# Patient Record
Sex: Male | Born: 1985 | Race: White | Hispanic: No | Marital: Married | Smoking: Never smoker
Health system: Southern US, Community
[De-identification: ages and names within clinical notes are randomized; demographics above are authoritative.]

## PROBLEM LIST (undated history)

## (undated) ENCOUNTER — Emergency Department (HOSPITAL_COMMUNITY): Discharge status: Absent without leave | Payer: Self-pay

## (undated) HISTORY — PX: KNEE REPAIR EXTENSOR MECHANISM: SHX6613

---

## 2008-01-31 ENCOUNTER — Emergency Department (HOSPITAL_COMMUNITY): Admission: EM | Admit: 2008-01-31 | Discharge: 2008-02-01 | Payer: Self-pay | Admitting: Emergency Medicine

## 2010-07-11 ENCOUNTER — Emergency Department (HOSPITAL_COMMUNITY): Admission: EM | Admit: 2010-07-11 | Discharge: 2010-07-11 | Payer: Self-pay | Admitting: Family Medicine

## 2011-09-07 LAB — URINALYSIS, ROUTINE W REFLEX MICROSCOPIC
Glucose, UA: NEGATIVE
Ketones, ur: >80 — AB
Leukocytes, UA: NEGATIVE
Nitrite: NEGATIVE
Protein, ur: NEGATIVE
Specific Gravity, Urine: 1.035 — ABNORMAL HIGH
Urobilinogen, UA: 1
pH: 6

## 2011-09-07 LAB — CBC
HCT: 42.5
Hemoglobin: 14.7
MCHC: 34.6
MCV: 84.1
Platelets: 261
RBC: 5.05
RDW: 13.8
WBC: 8.1

## 2011-09-07 LAB — COMPREHENSIVE METABOLIC PANEL WITH GFR
AST: 22
BUN: 20
Calcium: 9.2
GFR calc Af Amer: >60
GFR calc non Af Amer: >60
Glucose, Bld: 101 — ABNORMAL HIGH
Potassium: 3.4 — ABNORMAL LOW
Sodium: 139

## 2011-09-07 LAB — DIFFERENTIAL
Basophils Absolute: 0
Basophils Relative: 1
Eosinophils Absolute: 0.1
Eosinophils Relative: 2
Lymphocytes Relative: 38
Lymphs Abs: 3.1
Monocytes Absolute: 0.9
Monocytes Relative: 11
Neutro Abs: 3.9
Neutrophils Relative %: 49

## 2011-09-07 LAB — COMPREHENSIVE METABOLIC PANEL
ALT: 15
Albumin: 4.1
Alkaline Phosphatase: 58
CO2: 25
Chloride: 104
Creatinine, Ser: 1.11
Total Bilirubin: 0.9
Total Protein: 7.1

## 2011-09-07 LAB — URINE MICROSCOPIC-ADD ON

## 2011-09-07 IMAGING — CR DG ANKLE COMPLETE 3+V*L*
3 series · 3 of 3 positions shown · non-contrast
Comparison: None.

CLINICAL DATA: Injury and pain laterally located.

LEFT ANKLE COMPLETE - 3+ VIEW

[view not recorded (1 of 3)]
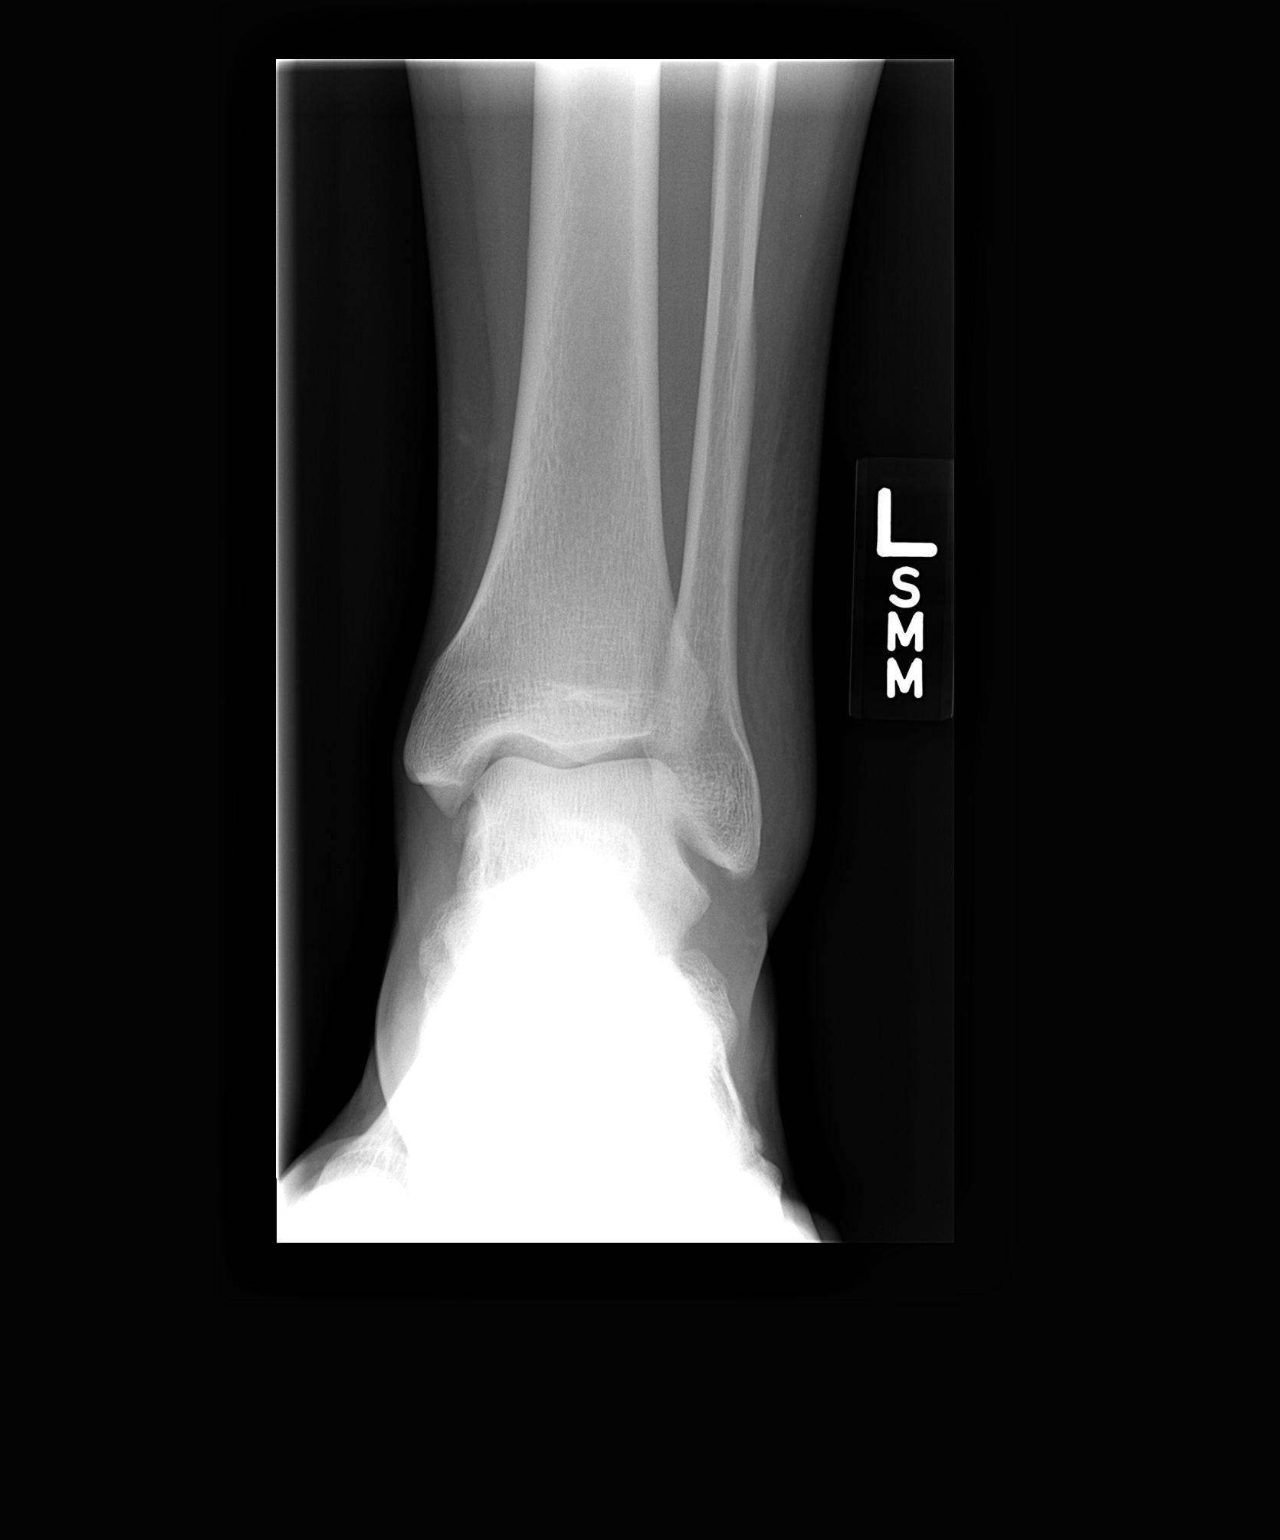

[view not recorded (2 of 3)]
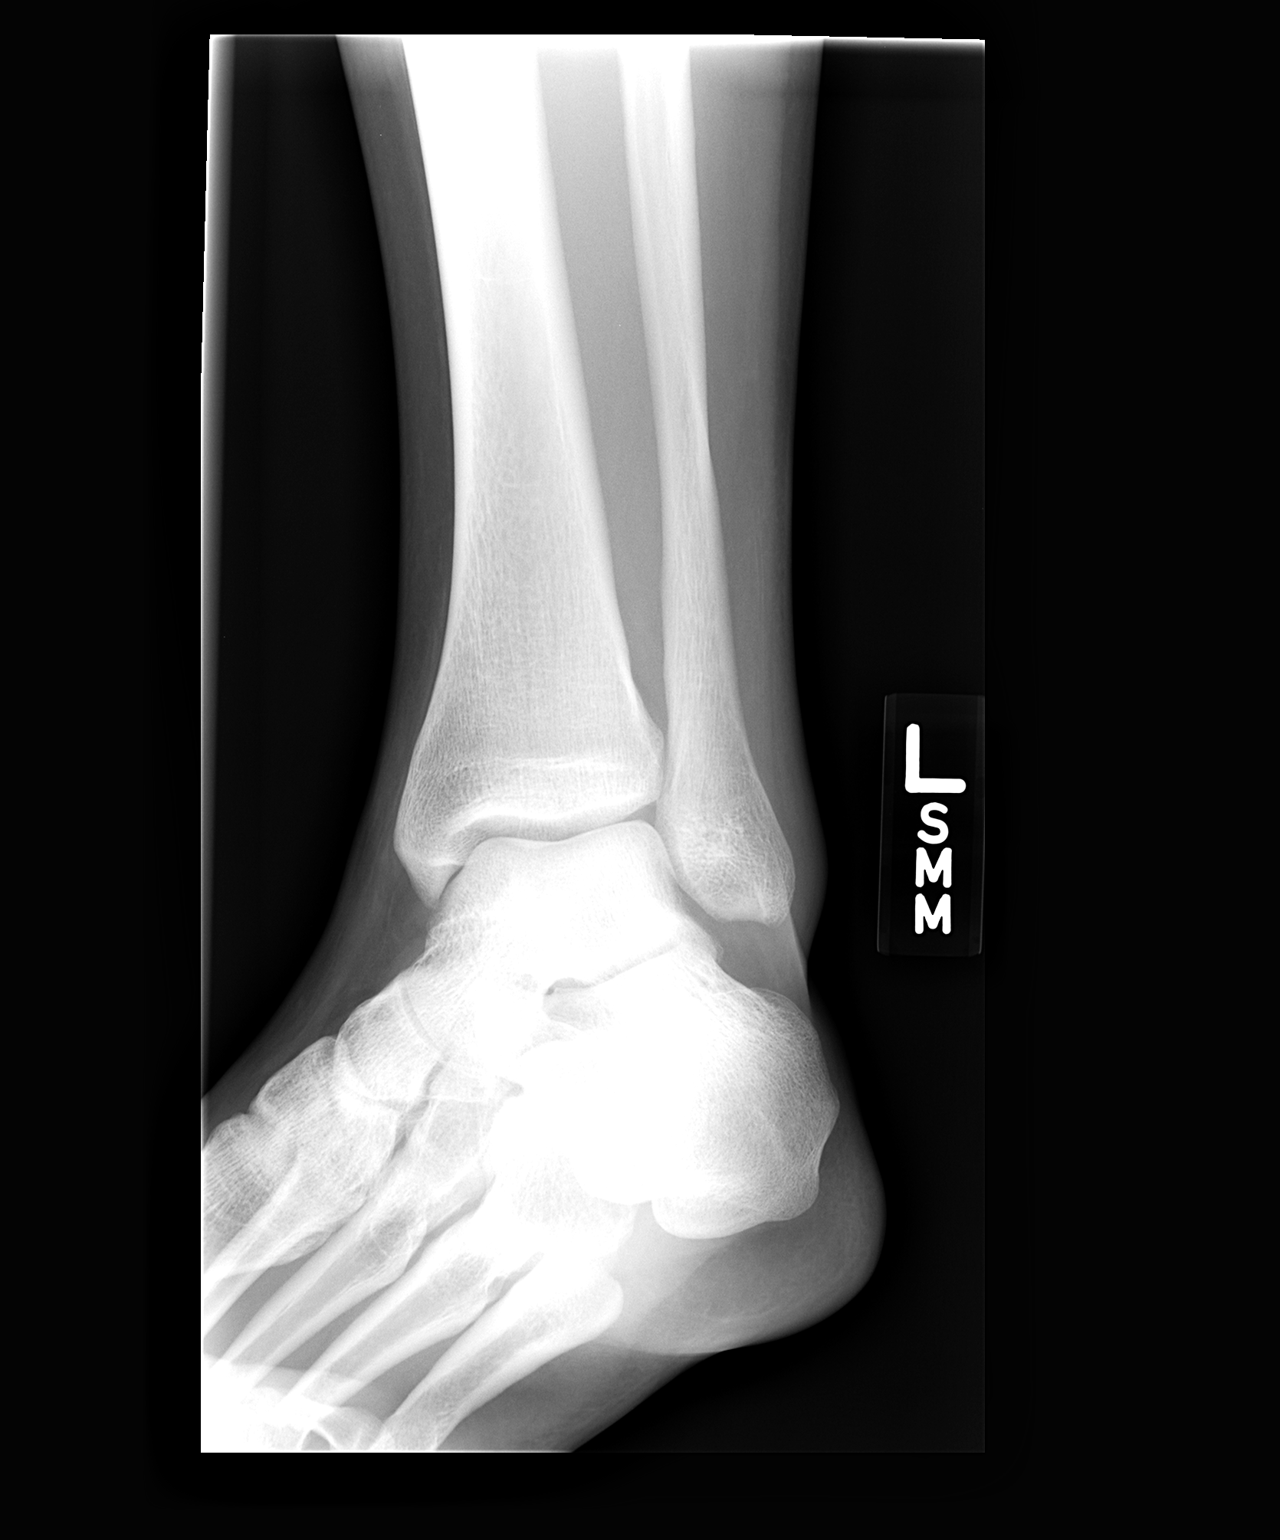

[view not recorded (3 of 3)]
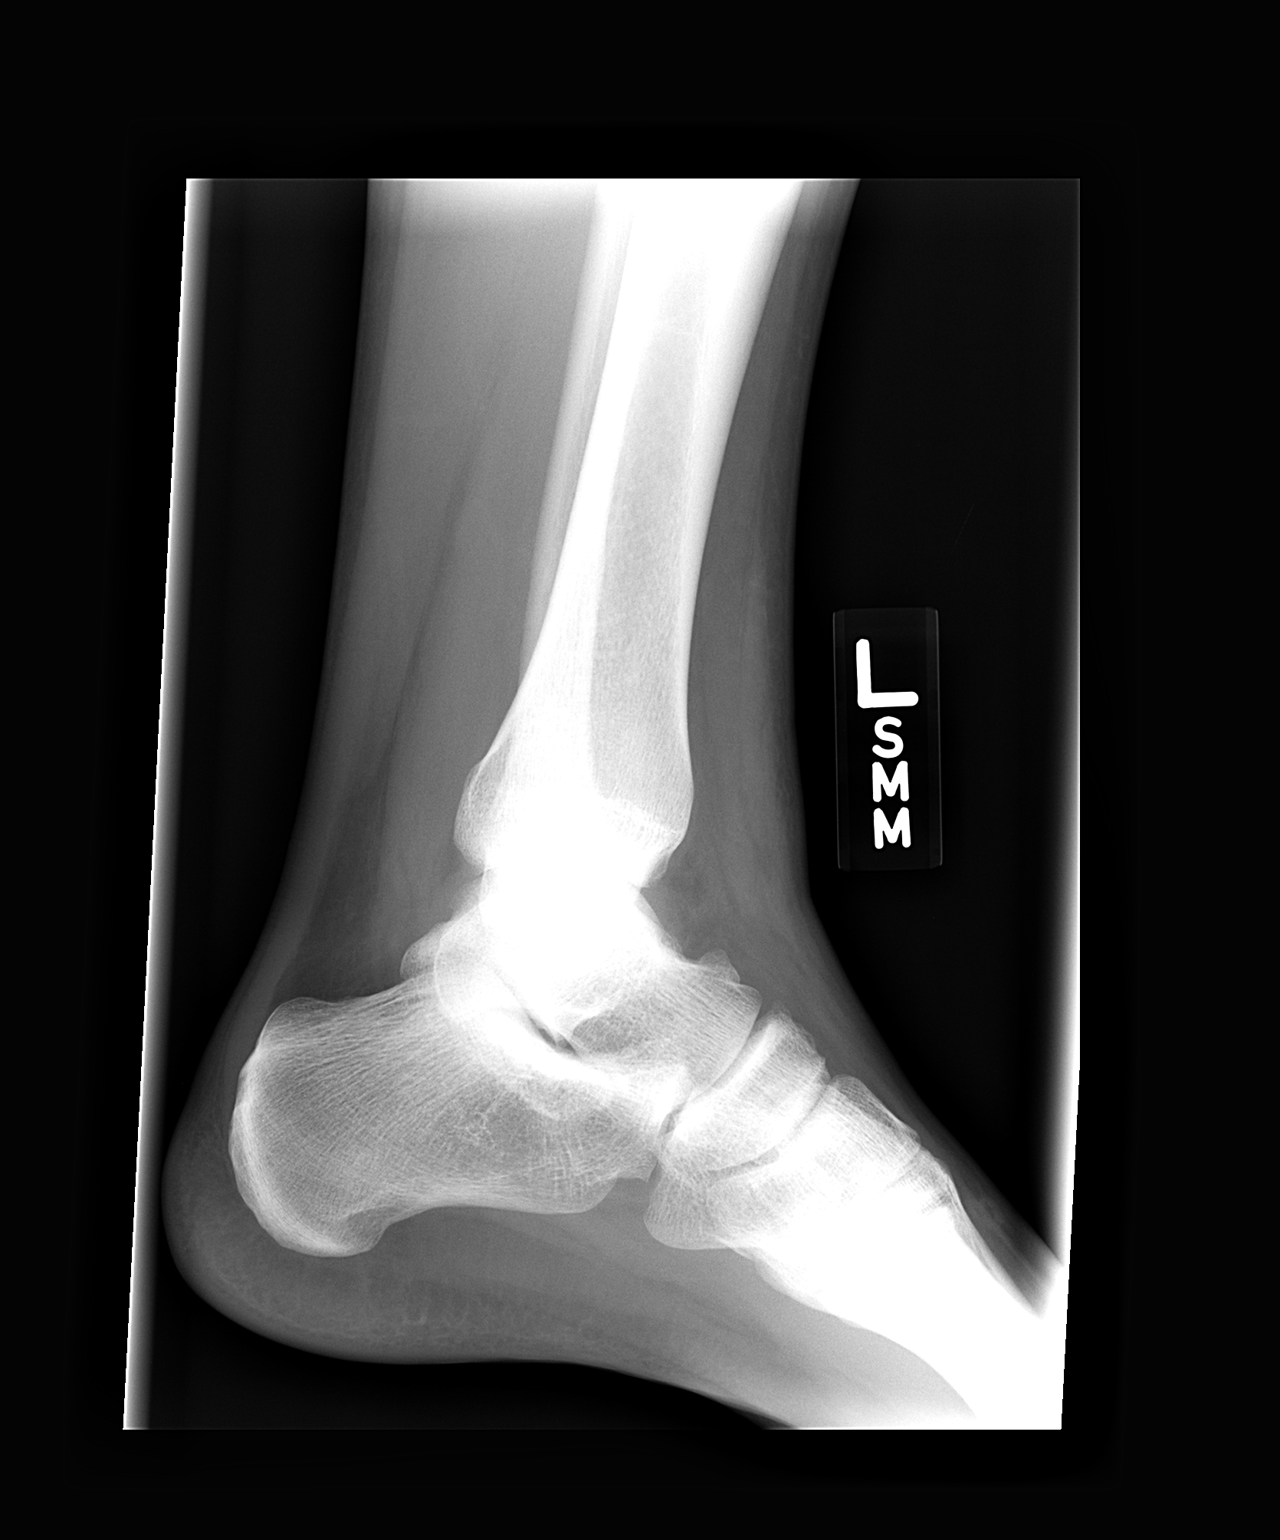

[3 of 3 positions shown; findings below may reference images not displayed]

FINDINGS: Soft tissue swelling is present laterally located.  There
are no fractures or subluxations.  The bones appear intrinsically
normal.
IMPRESSION: Soft tissue swelling.  Otherwise, negative study.

## 2016-04-19 DIAGNOSIS — M9904 Segmental and somatic dysfunction of sacral region: Secondary | ICD-10-CM | POA: Diagnosis not present

## 2016-04-19 DIAGNOSIS — M9903 Segmental and somatic dysfunction of lumbar region: Secondary | ICD-10-CM | POA: Diagnosis not present

## 2016-04-19 DIAGNOSIS — M9905 Segmental and somatic dysfunction of pelvic region: Secondary | ICD-10-CM | POA: Diagnosis not present

## 2016-04-19 DIAGNOSIS — M545 Low back pain: Secondary | ICD-10-CM | POA: Diagnosis not present

## 2016-05-28 ENCOUNTER — Ambulatory Visit (INDEPENDENT_AMBULATORY_CARE_PROVIDER_SITE_OTHER): Payer: BLUE CROSS/BLUE SHIELD | Admitting: Physician Assistant

## 2016-05-28 VITALS — BP 116/80 | HR 71 | Temp 97.8°F | Resp 18 | Ht 71.0 in | Wt 213.0 lb

## 2016-05-28 DIAGNOSIS — L550 Sunburn of first degree: Secondary | ICD-10-CM

## 2016-05-28 MED ORDER — PREDNISONE 20 MG PO TABS: ORAL_TABLET | ORAL | Status: AC

## 2016-05-28 MED ORDER — HYDROXYZINE HCL 25 MG PO TABS: 12.5000 mg | ORAL_TABLET | Freq: Three times a day (TID) | ORAL | Status: AC | PRN

## 2016-05-28 NOTE — Progress Notes (Signed)
   Patient ID: Thomas Weaver, male     DOB: [birth year], 30 y.o.    MRN: [id number]  PCP: No primary care provider on file.  Chief Complaint  Patient presents with  . sun poison    per patient     Subjective:    HPI  Presents for evaluation of "sun poisoning."  He reports a previous history of sun poisoning, with the same symptoms, and was treated with an injection with good and fast results. He requests the same.  [place] [facility] He has 3 young boys and on Friday, 6/09, he was responsible for all three out on the beach. He applied sunscreen, but didn't cover the lower back, as he couldn't reach it.  Skin is sore and pink. Itching is terribly distracting. No fever/chills. No nausea/vomiting.  He has taken NSAIDS and applied Aloe containing topical gel without relief.    Prior to Admission medications   Not on File     No Known Allergies   There are no active problems to display for this patient.    Family History  Problem Relation Age of Onset  . Cancer Mother 351    breast cancer     Social History   Social History  . Marital Status: Married    Spouse Name: Aggie [place]  . Number of Children: 3  . Years of Education: College   Occupational History  . flooring-commercial    Social History Main Topics  . Smoking status: Never Smoker   . Smokeless tobacco: Never Used  . Alcohol Use: No  . Drug Use: No  . Sexual Activity: Not on file   Other Topics Concern  . Not on file   Social History Narrative   Lives with his wife and their 3 sons.        Review of Systems  Constitutional: Negative for fever, chills and fatigue.  Gastrointestinal: Negative for nausea, vomiting and abdominal pain.  Skin: Positive for color change.         Objective:  Physical Exam  Constitutional: He is oriented to person, place, and time. He appears well-developed and well-nourished. He is active and cooperative. No distress.  BP  116/80 mmHg  Pulse 71  Temp(Src) 97.8 F (36.6 C) (Oral)  Resp 18  Ht 5\' 11"  (1.803 m)  Wt 213 lb (96.616 kg)  BMI 29.72 kg/m2  SpO2 98%   Eyes: Conjunctivae are normal.  Pulmonary/Chest: Effort normal.  Neurological: He is alert and oriented to person, place, and time.  Skin: Burn (lower back is pink, consistent with 1st degree sunburn. No vesicles, peeling, wounds) noted.  Psychiatric: He has a normal mood and affect. His speech is normal and behavior is normal.             Assessment & Plan:  1. Sunburn of first degree Supportive care. Patient is persistent in his request for steroids. While I recommended against them, there is no contraindication at this time. - hydrOXYzine (ATARAX/VISTARIL) 25 MG tablet; Take 0.5-1 tablets (12.5-25 mg total) by mouth every 8 (eight) hours as needed for itching.  Dispense: 10 tablet; Refill: 0 - predniSONE (DELTASONE) 20 MG tablet; Take 3 PO QAM x3days, 2 PO QAM x3days, 1 PO QAM x3days  Dispense: 18 tablet; Refill: 0   Fernande Brashelle S. Afsa Meany, PA-C Physician Assistant-Certified [facility]

## 2016-05-28 NOTE — Patient Instructions (Signed)
     IF you received an x-ray today, you will receive an invoice from [facility]. Please contact  Radiology at [phone] with questions or concerns regarding your invoice.   IF you received labwork today, you will receive an invoice from [facility]. Please contact [facility] at [phone] with questions or concerns regarding your invoice.   Our billing staff will not be able to assist you with questions regarding bills from these companies.  You will be contacted with the lab results as soon as they are available. The fastest way to get your results is to activate your My Chart account. Instructions are located on the last page of this paperwork. If you have not heard from us regarding the results in 2 weeks, please contact this office.      

## 2016-08-06 DIAGNOSIS — H40013 Open angle with borderline findings, low risk, bilateral: Secondary | ICD-10-CM | POA: Diagnosis not present

## 2016-08-27 DIAGNOSIS — L237 Allergic contact dermatitis due to plants, except food: Secondary | ICD-10-CM | POA: Diagnosis not present

## 2017-01-14 DIAGNOSIS — J029 Acute pharyngitis, unspecified: Secondary | ICD-10-CM | POA: Diagnosis not present

## 2017-02-07 DIAGNOSIS — L290 Pruritus ani: Secondary | ICD-10-CM | POA: Diagnosis not present

## 2017-04-05 DIAGNOSIS — Z3009 Encounter for other general counseling and advice on contraception: Secondary | ICD-10-CM | POA: Diagnosis not present

## 2017-04-29 DIAGNOSIS — M545 Low back pain: Secondary | ICD-10-CM | POA: Diagnosis not present

## 2017-04-29 DIAGNOSIS — M546 Pain in thoracic spine: Secondary | ICD-10-CM | POA: Diagnosis not present

## 2017-04-29 DIAGNOSIS — M9903 Segmental and somatic dysfunction of lumbar region: Secondary | ICD-10-CM | POA: Diagnosis not present

## 2017-04-29 DIAGNOSIS — M9904 Segmental and somatic dysfunction of sacral region: Secondary | ICD-10-CM | POA: Diagnosis not present

## 2017-05-31 DIAGNOSIS — Z302 Encounter for sterilization: Secondary | ICD-10-CM | POA: Diagnosis not present

## 2017-09-02 DIAGNOSIS — M9904 Segmental and somatic dysfunction of sacral region: Secondary | ICD-10-CM | POA: Diagnosis not present

## 2017-09-02 DIAGNOSIS — M545 Low back pain: Secondary | ICD-10-CM | POA: Diagnosis not present

## 2017-09-02 DIAGNOSIS — M542 Cervicalgia: Secondary | ICD-10-CM | POA: Diagnosis not present

## 2017-09-02 DIAGNOSIS — M9903 Segmental and somatic dysfunction of lumbar region: Secondary | ICD-10-CM | POA: Diagnosis not present

## 2017-09-09 DIAGNOSIS — M9903 Segmental and somatic dysfunction of lumbar region: Secondary | ICD-10-CM | POA: Diagnosis not present

## 2017-09-09 DIAGNOSIS — M542 Cervicalgia: Secondary | ICD-10-CM | POA: Diagnosis not present

## 2017-09-09 DIAGNOSIS — M9904 Segmental and somatic dysfunction of sacral region: Secondary | ICD-10-CM | POA: Diagnosis not present

## 2017-09-09 DIAGNOSIS — M545 Low back pain: Secondary | ICD-10-CM | POA: Diagnosis not present

## 2018-03-11 DIAGNOSIS — M545 Low back pain: Secondary | ICD-10-CM | POA: Diagnosis not present

## 2018-03-11 DIAGNOSIS — M542 Cervicalgia: Secondary | ICD-10-CM | POA: Diagnosis not present

## 2018-03-11 DIAGNOSIS — M546 Pain in thoracic spine: Secondary | ICD-10-CM | POA: Diagnosis not present

## 2018-03-11 DIAGNOSIS — M9903 Segmental and somatic dysfunction of lumbar region: Secondary | ICD-10-CM | POA: Diagnosis not present

## 2019-01-26 DIAGNOSIS — M542 Cervicalgia: Secondary | ICD-10-CM | POA: Diagnosis not present

## 2019-01-26 DIAGNOSIS — M9904 Segmental and somatic dysfunction of sacral region: Secondary | ICD-10-CM | POA: Diagnosis not present

## 2019-01-26 DIAGNOSIS — M545 Low back pain: Secondary | ICD-10-CM | POA: Diagnosis not present

## 2019-01-26 DIAGNOSIS — M9903 Segmental and somatic dysfunction of lumbar region: Secondary | ICD-10-CM | POA: Diagnosis not present

## 2019-01-28 DIAGNOSIS — M542 Cervicalgia: Secondary | ICD-10-CM | POA: Diagnosis not present

## 2019-01-28 DIAGNOSIS — M545 Low back pain: Secondary | ICD-10-CM | POA: Diagnosis not present

## 2019-01-28 DIAGNOSIS — M9904 Segmental and somatic dysfunction of sacral region: Secondary | ICD-10-CM | POA: Diagnosis not present

## 2019-01-28 DIAGNOSIS — M9903 Segmental and somatic dysfunction of lumbar region: Secondary | ICD-10-CM | POA: Diagnosis not present

## 2019-02-09 DIAGNOSIS — M542 Cervicalgia: Secondary | ICD-10-CM | POA: Diagnosis not present

## 2019-02-09 DIAGNOSIS — M9904 Segmental and somatic dysfunction of sacral region: Secondary | ICD-10-CM | POA: Diagnosis not present

## 2019-02-09 DIAGNOSIS — M545 Low back pain: Secondary | ICD-10-CM | POA: Diagnosis not present

## 2019-02-09 DIAGNOSIS — M9903 Segmental and somatic dysfunction of lumbar region: Secondary | ICD-10-CM | POA: Diagnosis not present

## 2019-02-11 DIAGNOSIS — M542 Cervicalgia: Secondary | ICD-10-CM | POA: Diagnosis not present

## 2019-02-11 DIAGNOSIS — M9904 Segmental and somatic dysfunction of sacral region: Secondary | ICD-10-CM | POA: Diagnosis not present

## 2019-02-11 DIAGNOSIS — M545 Low back pain: Secondary | ICD-10-CM | POA: Diagnosis not present

## 2019-02-11 DIAGNOSIS — M9903 Segmental and somatic dysfunction of lumbar region: Secondary | ICD-10-CM | POA: Diagnosis not present

## 2019-04-06 DIAGNOSIS — L237 Allergic contact dermatitis due to plants, except food: Secondary | ICD-10-CM | POA: Diagnosis not present

## 2019-04-21 ENCOUNTER — Encounter: Payer: Self-pay | Admitting: Orthopaedic Surgery

## 2019-04-21 ENCOUNTER — Other Ambulatory Visit: Payer: Self-pay

## 2019-04-21 ENCOUNTER — Ambulatory Visit: Payer: BLUE CROSS/BLUE SHIELD

## 2019-04-21 ENCOUNTER — Ambulatory Visit (INDEPENDENT_AMBULATORY_CARE_PROVIDER_SITE_OTHER): Payer: BLUE CROSS/BLUE SHIELD | Admitting: Orthopaedic Surgery

## 2019-04-21 DIAGNOSIS — M7541 Impingement syndrome of right shoulder: Secondary | ICD-10-CM

## 2019-04-21 DIAGNOSIS — M25511 Pain in right shoulder: Secondary | ICD-10-CM | POA: Diagnosis not present

## 2019-04-21 MED ORDER — LIDOCAINE HCL 1 % IJ SOLN
3.0000 mL | INTRAMUSCULAR | Status: AC | PRN
  Administered 2019-04-21: 16:00:00 3 mL

## 2019-04-21 MED ORDER — METHYLPREDNISOLONE ACETATE 40 MG/ML IJ SUSP
40.0000 mg | INTRAMUSCULAR | Status: AC | PRN
  Administered 2019-04-21: 40 mg via INTRA_ARTICULAR

## 2019-04-21 NOTE — Progress Notes (Signed)
Office Visit Note   Patient: Thomas Weaver           Date of Birth: 1986/10/26           MRN: [id number] Visit Date: 04/21/2019              Requested by: No referring provider defined for this encounter. PCP: Patient, No Pcp Per   Assessment & Plan: Visit Diagnoses:  1. Right shoulder pain, unspecified chronicity   2. Impingement syndrome of right shoulder     Plan: I do feel that he has quite severe right shoulder tendinitis and bursitis and he would benefit from a steroid injection in this area.  He agrees with this and tolerated it well.  I explained the risk and benefits of injections.  I did show him a set exercises using a Thera-Band to work on his shoulder in general.  I would like to see him back in about 2 weeks to see how he is doing overall.  Follow-Up Instructions: Return in about 2 weeks (around 05/05/2019).   Orders:  Orders Placed This Encounter  Procedures  . Large Joint Inj  . XR Shoulder Right   No orders of the defined types were placed in this encounter.     Procedures: Large Joint Inj: R subacromial bursa on 04/21/2019 4:08 PM Indications: pain and diagnostic evaluation Details: 22 G 1.5 in needle  Arthrogram: No  Medications: 3 mL lidocaine 1 %; 40 mg methylPREDNISolone acetate 40 MG/ML Outcome: tolerated well, no immediate complications Procedure, treatment alternatives, risks and benefits explained, specific risks discussed. Consent was given by the patient. Immediately prior to procedure a time out was called to verify the correct patient, procedure, equipment, support staff and site/side marked as required. Patient was prepped and draped in the usual sterile fashion.       Clinical Data: No additional findings.   Subjective: Chief Complaint  Patient presents with  . Right Shoulder - Pain  The patient comes in today with acute right shoulder pain.  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  This is radiating into his neck some.  He has had some decreased range of motion with reaching behind him and overhead since then.  He has never injured the shoulder before.  He denies any hand weakness on that right side and denies any numbness and tingling in his right hand.  He is not a diabetic.  HPI  Review of Systems He currently denies any headache, chest pain, shortness of breath, fever, chills, nausea, vomiting  Objective: Vital Signs: There were no vitals taken for this visit.  Physical Exam He is alert and oriented x3 and in no acute distress Ortho Exam Examination of his right shoulder shows significant limitations with internal rotation and a deduction with reaching onto his lower lumbar spine when he reaches behind him.  He has normal motion on the left shoulder.  He he uses rotator cuff to actually abduct his right shoulder and he can reach fully overhead but is very painful to do that.  His external rotation is full.  He is weak with his liftoff on the right side. Specialty Comments:  No specialty comments available.  Imaging: Xr Shoulder Right  Result Date: 04/21/2019 3 views of the right shoulder show a slight decrease in the subacromial outlet but otherwise well located shoulder with  no acute findings.    PMFS History: There are no active problems to display for this patient.  History reviewed. No pertinent past medical history.  Family History  Problem Relation Age of Onset  . Cancer Mother 30       breast cancer    Past Surgical History:  Procedure Laterality Date  . KNEE REPAIR EXTENSOR MECHANISM     Social History   Occupational History  . Occupation: flooring-commercial  Tobacco Use  . Smoking status: Never Smoker  . Smokeless tobacco: Never Used  Substance and Sexual Activity  . Alcohol use: No    Alcohol/week: 0.0 standard drinks  . Drug use: No  . Sexual activity:  Not on file

## 2019-04-30 ENCOUNTER — Encounter: Payer: Self-pay | Admitting: Orthopaedic Surgery

## 2019-04-30 ENCOUNTER — Other Ambulatory Visit: Payer: Self-pay | Admitting: Radiology

## 2019-04-30 ENCOUNTER — Other Ambulatory Visit: Payer: Self-pay

## 2019-04-30 ENCOUNTER — Ambulatory Visit (INDEPENDENT_AMBULATORY_CARE_PROVIDER_SITE_OTHER): Payer: BLUE CROSS/BLUE SHIELD | Admitting: Orthopaedic Surgery

## 2019-04-30 DIAGNOSIS — M25511 Pain in right shoulder: Secondary | ICD-10-CM

## 2019-04-30 MED ORDER — HYDROCODONE-ACETAMINOPHEN 5-325 MG PO TABS: 1.0000 | ORAL_TABLET | Freq: Four times a day (QID) | ORAL | 0 refills | Status: AC | PRN

## 2019-04-30 MED ORDER — METHYLPREDNISOLONE 4 MG PO TABS: ORAL_TABLET | ORAL | 0 refills | Status: AC

## 2019-04-30 NOTE — Progress Notes (Signed)
The patient comes in today with continued acute severe right shoulder pain.  He had injured this shoulder building a tree house for his kids.  I placed a steroid injection at his last visit and subacromial outlet area very sparingly nares.  The injection barely helped at all.  He has still severe right shoulder pain.  On exam today he is significant and severe right shoulder pain.  He hurts at the [facility] joint and along the subacromial outlet.  It has gotten significantly worse.  Clinically the shoulder is well located but he has a lot of guarding of that shoulder and does not want me to move it much.  At this point I do feel an MRI is medically and clinically warranted of the right shoulder rule out rotator cuff tear based on the worsening on clinical exam and the severity of his pain.  I would have him still work on a home exercise program to try to get the shoulder moving and stretching it.  I will send in a 6-day steroid taper as well as an occasional hydrocodone.  We will see him back once we have the MRI of his right shoulder.  All question concerns were answered and addressed.

## 2019-04-30 NOTE — Progress Notes (Signed)
Mri

## 2019-05-05 ENCOUNTER — Ambulatory Visit: Payer: BLUE CROSS/BLUE SHIELD | Admitting: Orthopaedic Surgery

## 2019-05-06 ENCOUNTER — Ambulatory Visit: Payer: BLUE CROSS/BLUE SHIELD | Admitting: Orthopaedic Surgery

## 2019-05-22 ENCOUNTER — Other Ambulatory Visit: Payer: BLUE CROSS/BLUE SHIELD

## 2019-06-15 DIAGNOSIS — M9903 Segmental and somatic dysfunction of lumbar region: Secondary | ICD-10-CM | POA: Diagnosis not present

## 2019-06-15 DIAGNOSIS — M542 Cervicalgia: Secondary | ICD-10-CM | POA: Diagnosis not present

## 2019-06-15 DIAGNOSIS — M545 Low back pain: Secondary | ICD-10-CM | POA: Diagnosis not present

## 2019-06-15 DIAGNOSIS — M9904 Segmental and somatic dysfunction of sacral region: Secondary | ICD-10-CM | POA: Diagnosis not present

## 2019-06-17 DIAGNOSIS — M545 Low back pain: Secondary | ICD-10-CM | POA: Diagnosis not present

## 2019-06-17 DIAGNOSIS — M9904 Segmental and somatic dysfunction of sacral region: Secondary | ICD-10-CM | POA: Diagnosis not present

## 2019-06-17 DIAGNOSIS — M9903 Segmental and somatic dysfunction of lumbar region: Secondary | ICD-10-CM | POA: Diagnosis not present

## 2019-06-17 DIAGNOSIS — M542 Cervicalgia: Secondary | ICD-10-CM | POA: Diagnosis not present

## 2019-06-22 DIAGNOSIS — M9904 Segmental and somatic dysfunction of sacral region: Secondary | ICD-10-CM | POA: Diagnosis not present

## 2019-06-22 DIAGNOSIS — M545 Low back pain: Secondary | ICD-10-CM | POA: Diagnosis not present

## 2019-06-22 DIAGNOSIS — M542 Cervicalgia: Secondary | ICD-10-CM | POA: Diagnosis not present

## 2019-06-22 DIAGNOSIS — M9903 Segmental and somatic dysfunction of lumbar region: Secondary | ICD-10-CM | POA: Diagnosis not present

## 2019-10-20 DIAGNOSIS — Z0000 Encounter for general adult medical examination without abnormal findings: Secondary | ICD-10-CM | POA: Diagnosis not present

## 2019-10-20 DIAGNOSIS — Z23 Encounter for immunization: Secondary | ICD-10-CM | POA: Diagnosis not present

## 2019-10-28 DIAGNOSIS — Z0000 Encounter for general adult medical examination without abnormal findings: Secondary | ICD-10-CM | POA: Diagnosis not present

## 2019-10-28 DIAGNOSIS — Z1159 Encounter for screening for other viral diseases: Secondary | ICD-10-CM | POA: Diagnosis not present

## 2019-11-13 DIAGNOSIS — Z9109 Other allergy status, other than to drugs and biological substances: Secondary | ICD-10-CM | POA: Diagnosis not present

## 2019-11-13 DIAGNOSIS — H1031 Unspecified acute conjunctivitis, right eye: Secondary | ICD-10-CM | POA: Diagnosis not present

## 2020-06-24 ENCOUNTER — Other Ambulatory Visit: Payer: Self-pay | Admitting: Orthopaedic Surgery

## 2020-06-24 DIAGNOSIS — M25511 Pain in right shoulder: Secondary | ICD-10-CM
